# Patient Record
Sex: Male | Born: 2000 | Race: White | Hispanic: No | Marital: Single | Smoking: Never smoker
Health system: Southern US, Community
[De-identification: ages and names within clinical notes are randomized; demographics above are authoritative.]

---
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
## 2019-07-26 IMAGING — CR DG CHEST 2V
2 series · 2 of 2 positions shown · non-contrast
Comparison: None.

CLINICAL DATA: Acute onset of headache, lightheadedness, blurred
vision, generalized chest pain, nausea and mild congestion.

EXAM:
CHEST - 2 VIEW

[w chest pa]
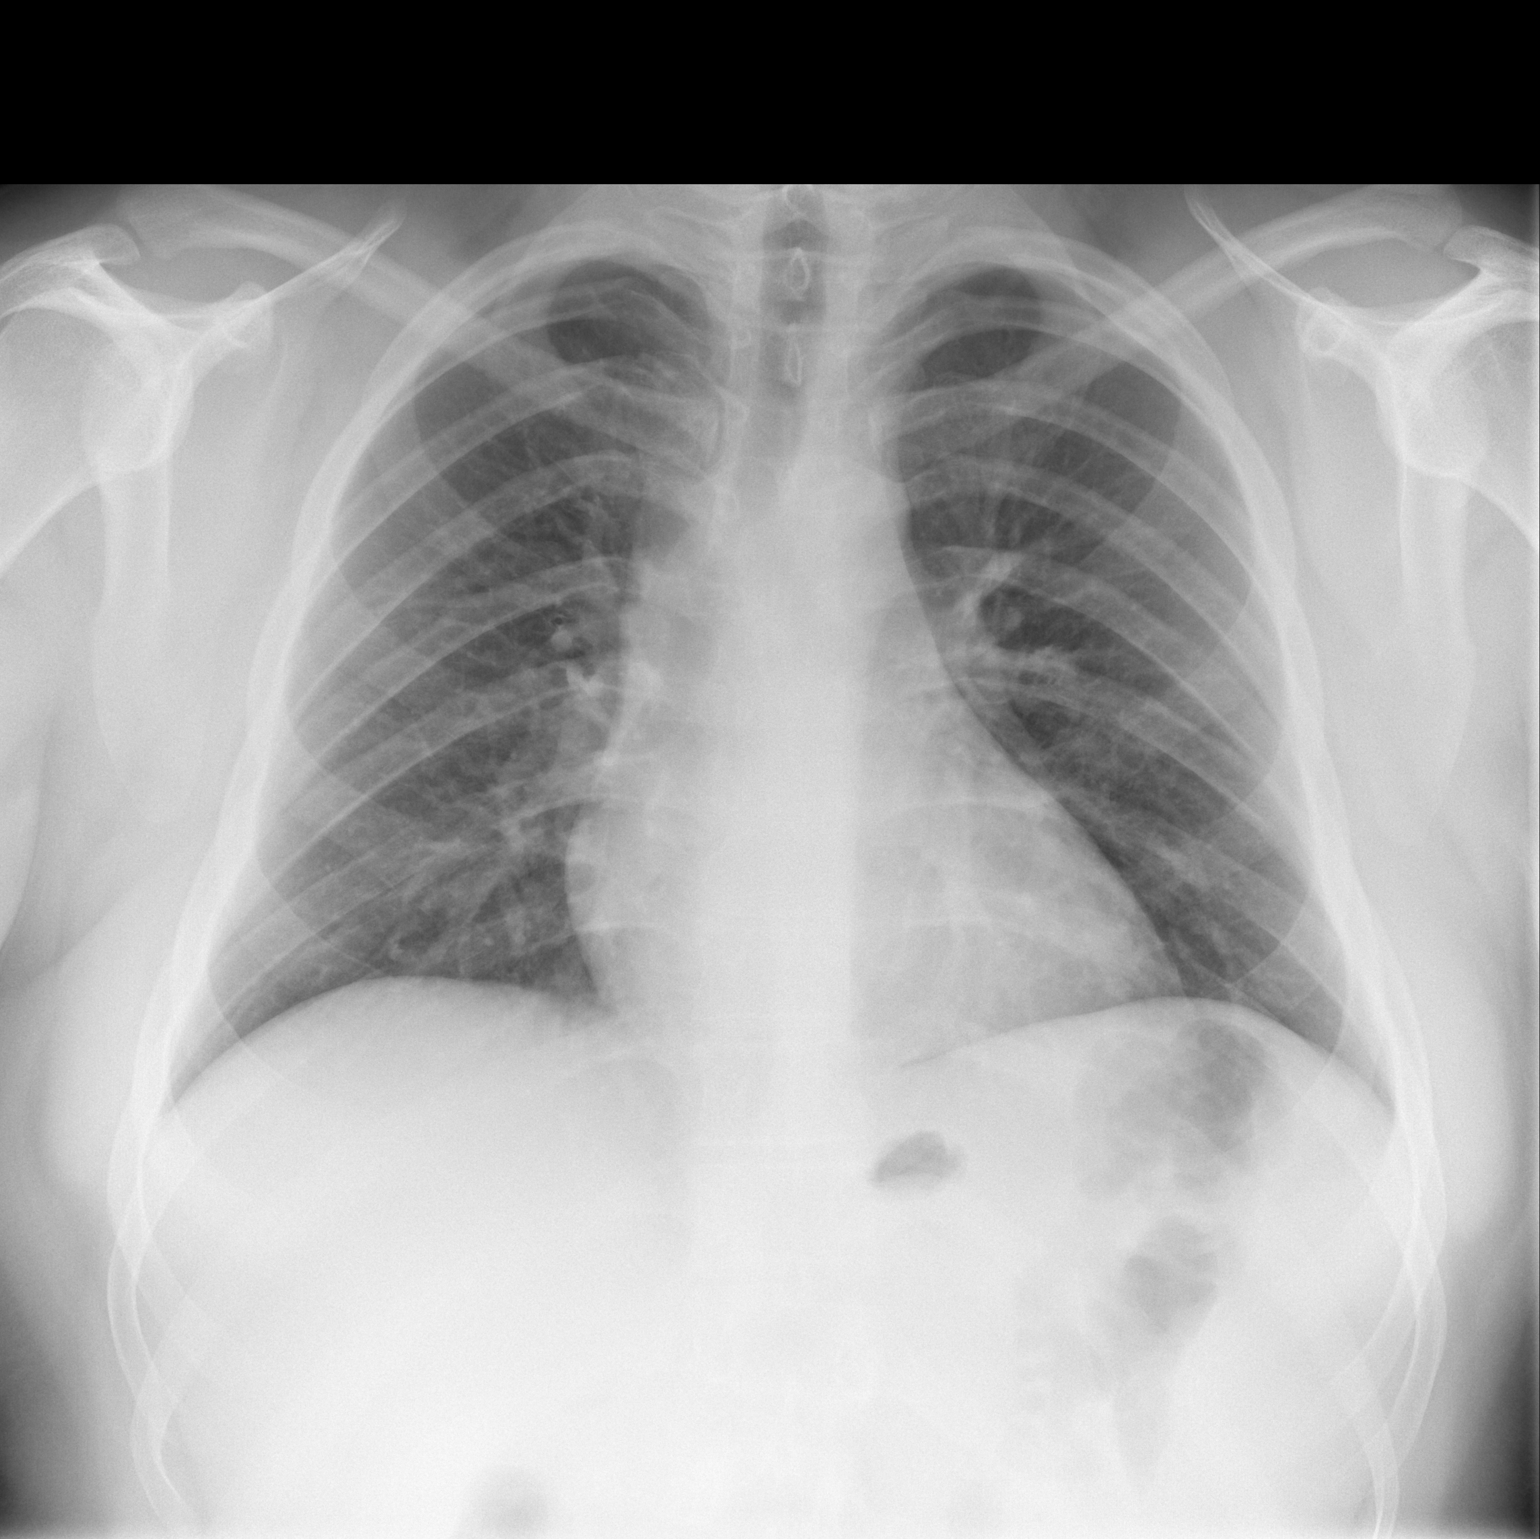

[w chest lat]
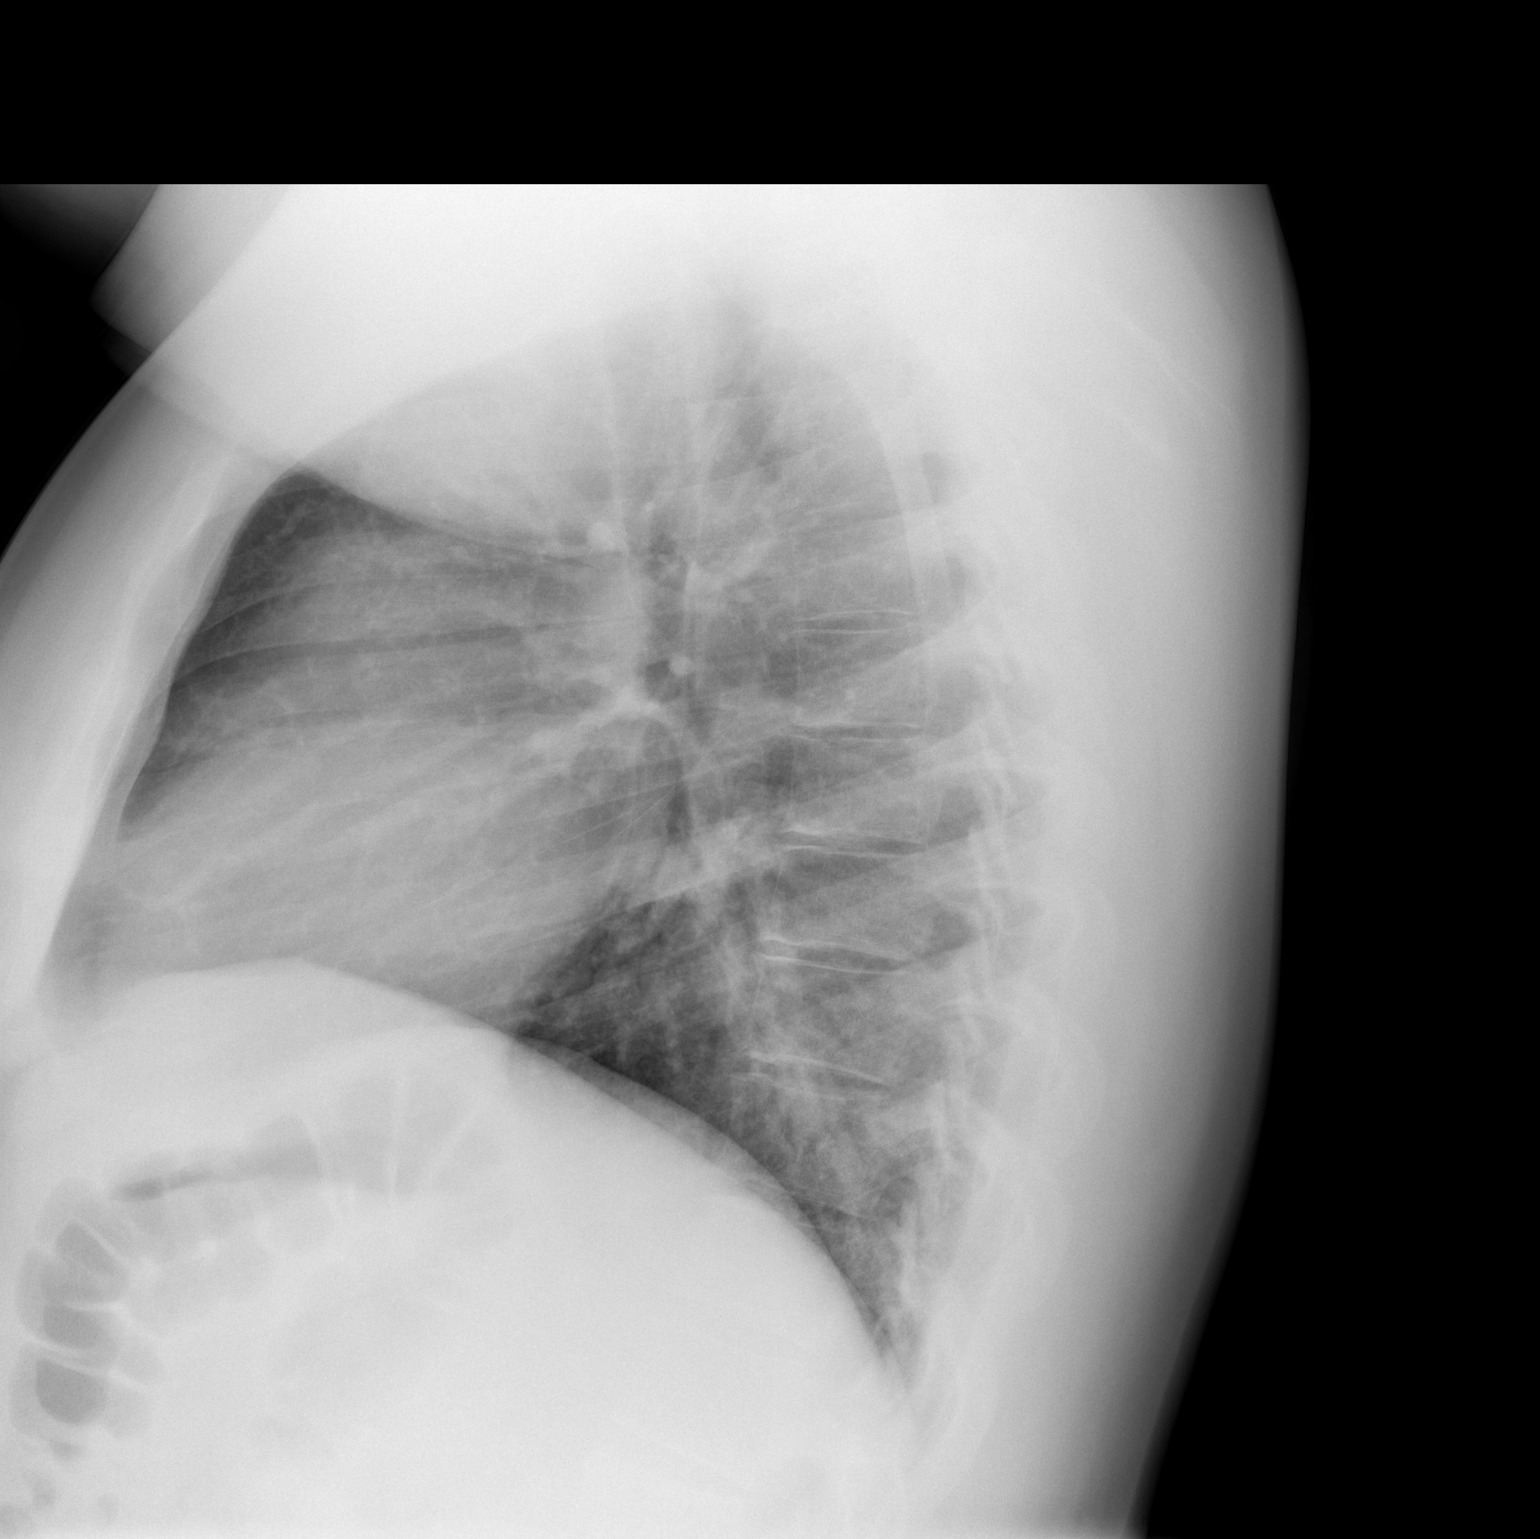

[2 of 2 positions shown; findings below may reference images not displayed]

FINDINGS: The lungs are well-aerated and clear. There is no evidence of focal
opacification, pleural effusion or pneumothorax.

The heart is normal in size; the mediastinal contour is within
normal limits. No acute osseous abnormalities are seen.
IMPRESSION: No acute cardiopulmonary process seen.
